# Patient Record
Sex: Female | Born: 1970 | Race: White | Hispanic: No | Marital: Married | Smoking: Never smoker
Health system: Southern US, Community
[De-identification: ages and names within clinical notes are randomized; demographics above are authoritative.]

---

## 2004-01-25 ENCOUNTER — Other Ambulatory Visit: Admission: RE | Admit: 2004-01-25 | Discharge: 2004-01-25 | Payer: Self-pay | Admitting: Gynecology

## 2004-03-13 ENCOUNTER — Ambulatory Visit: Payer: Self-pay | Admitting: Oncology

## 2004-03-29 ENCOUNTER — Other Ambulatory Visit: Admission: RE | Admit: 2004-03-29 | Discharge: 2004-03-29 | Payer: Self-pay | Admitting: Obstetrics and Gynecology

## 2004-06-06 ENCOUNTER — Ambulatory Visit: Payer: Self-pay | Admitting: Oncology

## 2004-07-13 ENCOUNTER — Ambulatory Visit (HOSPITAL_COMMUNITY): Admission: RE | Admit: 2004-07-13 | Discharge: 2004-07-13 | Payer: Self-pay | Admitting: Obstetrics and Gynecology

## 2004-08-24 ENCOUNTER — Inpatient Hospital Stay (HOSPITAL_COMMUNITY): Admission: AD | Admit: 2004-08-24 | Discharge: 2004-08-24 | Payer: Self-pay | Admitting: Obstetrics and Gynecology

## 2004-09-28 ENCOUNTER — Ambulatory Visit: Payer: Self-pay | Admitting: Oncology

## 2004-10-17 ENCOUNTER — Inpatient Hospital Stay (HOSPITAL_COMMUNITY): Admission: AD | Admit: 2004-10-17 | Discharge: 2004-10-20 | Payer: Self-pay | Admitting: Obstetrics and Gynecology

## 2004-11-13 ENCOUNTER — Ambulatory Visit: Payer: Self-pay | Admitting: Oncology

## 2004-12-08 ENCOUNTER — Other Ambulatory Visit: Admission: RE | Admit: 2004-12-08 | Discharge: 2004-12-08 | Payer: Self-pay | Admitting: Obstetrics and Gynecology

## 2005-10-16 ENCOUNTER — Ambulatory Visit: Payer: Self-pay | Admitting: Oncology

## 2006-01-14 ENCOUNTER — Ambulatory Visit: Payer: Self-pay | Admitting: Oncology

## 2006-03-13 ENCOUNTER — Ambulatory Visit: Payer: Self-pay | Admitting: Oncology

## 2006-07-17 ENCOUNTER — Ambulatory Visit: Payer: Self-pay | Admitting: Oncology

## 2006-07-19 LAB — CBC WITH DIFFERENTIAL/PLATELET
BASO%: 0.2 % (ref 0.0–2.0)
Basophils Absolute: 0 10*3/uL (ref 0.0–0.1)
EOS%: 0.5 % (ref 0.0–7.0)
HGB: 13 g/dL (ref 11.6–15.9)
MCH: 30.3 pg (ref 26.0–34.0)
MCV: 86.3 fL (ref 81.0–101.0)
Platelets: 262 10*3/uL (ref 145–400)
RBC: 4.3 10*6/uL (ref 3.70–5.32)
WBC: 9.6 10*3/uL (ref 3.9–10.0)

## 2006-08-09 ENCOUNTER — Inpatient Hospital Stay (HOSPITAL_COMMUNITY): Admission: AD | Admit: 2006-08-09 | Discharge: 2006-08-11 | Payer: Self-pay | Admitting: Obstetrics and Gynecology

## 2006-08-14 LAB — CBC WITH DIFFERENTIAL/PLATELET
BASO%: 0.7 % (ref 0.0–2.0)
Basophils Absolute: 0.1 10*3/uL (ref 0.0–0.1)
EOS%: 1.2 % (ref 0.0–7.0)
HGB: 14.3 g/dL (ref 11.6–15.9)
LYMPH%: 15.1 % (ref 14.0–48.0)
MCV: 86.3 fL (ref 81.0–101.0)
MONO#: 0.4 10*3/uL (ref 0.1–0.9)
NEUT%: 79.9 % — ABNORMAL HIGH (ref 39.6–76.8)
RDW: 13.6 % (ref 11.3–14.5)
WBC: 13.1 10*3/uL — ABNORMAL HIGH (ref 3.9–10.0)
lymph#: 2 10*3/uL (ref 0.9–3.3)

## 2006-08-14 LAB — PROTIME-INR: Protime: 19.2 Seconds — ABNORMAL HIGH (ref 10.6–13.4)

## 2006-08-16 LAB — PROTIME-INR

## 2006-08-26 LAB — PROTIME-INR

## 2006-09-02 ENCOUNTER — Ambulatory Visit: Payer: Self-pay | Admitting: Oncology

## 2006-09-02 LAB — PROTIME-INR: Protime: 46.8 Seconds — ABNORMAL HIGH (ref 10.6–13.4)

## 2007-08-11 ENCOUNTER — Ambulatory Visit: Payer: Self-pay | Admitting: Oncology

## 2008-08-06 ENCOUNTER — Ambulatory Visit: Payer: Self-pay | Admitting: Oncology

## 2014-02-26 ENCOUNTER — Other Ambulatory Visit: Payer: Self-pay | Admitting: Family Medicine

## 2014-02-26 DIAGNOSIS — R202 Paresthesia of skin: Secondary | ICD-10-CM

## 2014-03-07 ENCOUNTER — Ambulatory Visit
Admission: RE | Admit: 2014-03-07 | Discharge: 2014-03-07 | Disposition: A | Discharge status: Home / Self Care | Payer: BC Managed Care – PPO | Source: Ambulatory Visit | Attending: Family Medicine | Admitting: Family Medicine

## 2014-03-07 DIAGNOSIS — R202 Paresthesia of skin: Secondary | ICD-10-CM

## 2014-03-07 MED ORDER — GADOBENATE DIMEGLUMINE 529 MG/ML IV SOLN
15.0000 mL | Freq: Once | INTRAVENOUS | Status: AC | PRN
  Administered 2014-03-07: 15 mL via INTRAVENOUS

## 2015-09-17 DIAGNOSIS — L259 Unspecified contact dermatitis, unspecified cause: Secondary | ICD-10-CM | POA: Diagnosis not present

## 2015-09-23 DIAGNOSIS — L309 Dermatitis, unspecified: Secondary | ICD-10-CM | POA: Diagnosis not present

## 2015-09-23 DIAGNOSIS — L301 Dyshidrosis [pompholyx]: Secondary | ICD-10-CM | POA: Diagnosis not present

## 2015-10-04 DIAGNOSIS — L308 Other specified dermatitis: Secondary | ICD-10-CM | POA: Diagnosis not present

## 2015-10-04 DIAGNOSIS — L309 Dermatitis, unspecified: Secondary | ICD-10-CM | POA: Diagnosis not present

## 2016-01-24 DIAGNOSIS — R0781 Pleurodynia: Secondary | ICD-10-CM | POA: Diagnosis not present

## 2016-01-24 DIAGNOSIS — Z23 Encounter for immunization: Secondary | ICD-10-CM | POA: Diagnosis not present

## 2016-01-24 DIAGNOSIS — F418 Other specified anxiety disorders: Secondary | ICD-10-CM | POA: Diagnosis not present

## 2016-02-03 DIAGNOSIS — Z6830 Body mass index (BMI) 30.0-30.9, adult: Secondary | ICD-10-CM | POA: Diagnosis not present

## 2016-02-03 DIAGNOSIS — Z1212 Encounter for screening for malignant neoplasm of rectum: Secondary | ICD-10-CM | POA: Diagnosis not present

## 2016-02-03 DIAGNOSIS — Z1231 Encounter for screening mammogram for malignant neoplasm of breast: Secondary | ICD-10-CM | POA: Diagnosis not present

## 2016-02-03 DIAGNOSIS — Z01419 Encounter for gynecological examination (general) (routine) without abnormal findings: Secondary | ICD-10-CM | POA: Diagnosis not present

## 2016-09-28 DIAGNOSIS — N958 Other specified menopausal and perimenopausal disorders: Secondary | ICD-10-CM | POA: Diagnosis not present

## 2016-09-28 DIAGNOSIS — Z13820 Encounter for screening for osteoporosis: Secondary | ICD-10-CM | POA: Diagnosis not present

## 2017-02-18 DIAGNOSIS — Z23 Encounter for immunization: Secondary | ICD-10-CM | POA: Diagnosis not present

## 2017-02-18 DIAGNOSIS — F418 Other specified anxiety disorders: Secondary | ICD-10-CM | POA: Diagnosis not present

## 2017-05-17 DIAGNOSIS — Z6830 Body mass index (BMI) 30.0-30.9, adult: Secondary | ICD-10-CM | POA: Diagnosis not present

## 2017-05-17 DIAGNOSIS — Z01419 Encounter for gynecological examination (general) (routine) without abnormal findings: Secondary | ICD-10-CM | POA: Diagnosis not present

## 2017-05-17 DIAGNOSIS — Z1212 Encounter for screening for malignant neoplasm of rectum: Secondary | ICD-10-CM | POA: Diagnosis not present

## 2017-05-17 DIAGNOSIS — Z1231 Encounter for screening mammogram for malignant neoplasm of breast: Secondary | ICD-10-CM | POA: Diagnosis not present

## 2017-07-22 DIAGNOSIS — L602 Onychogryphosis: Secondary | ICD-10-CM | POA: Diagnosis not present

## 2017-09-23 DIAGNOSIS — R238 Other skin changes: Secondary | ICD-10-CM | POA: Diagnosis not present

## 2017-09-23 DIAGNOSIS — L602 Onychogryphosis: Secondary | ICD-10-CM | POA: Diagnosis not present

## 2017-10-15 DIAGNOSIS — B351 Tinea unguium: Secondary | ICD-10-CM | POA: Diagnosis not present

## 2017-10-22 DIAGNOSIS — H6123 Impacted cerumen, bilateral: Secondary | ICD-10-CM | POA: Diagnosis not present

## 2017-11-29 DIAGNOSIS — L02821 Furuncle of head [any part, except face]: Secondary | ICD-10-CM | POA: Diagnosis not present

## 2017-11-29 DIAGNOSIS — B9689 Other specified bacterial agents as the cause of diseases classified elsewhere: Secondary | ICD-10-CM | POA: Diagnosis not present

## 2018-01-16 ENCOUNTER — Emergency Department (HOSPITAL_COMMUNITY)
Admission: EM | Admit: 2018-01-16 | Discharge: 2018-01-16 | Disposition: A | Discharge status: Home / Self Care | Payer: BLUE CROSS/BLUE SHIELD | Attending: Emergency Medicine | Admitting: Emergency Medicine

## 2018-01-16 ENCOUNTER — Other Ambulatory Visit: Payer: Self-pay

## 2018-01-16 ENCOUNTER — Encounter (HOSPITAL_COMMUNITY): Payer: Self-pay | Admitting: Emergency Medicine

## 2018-01-16 DIAGNOSIS — Y999 Unspecified external cause status: Secondary | ICD-10-CM | POA: Insufficient documentation

## 2018-01-16 DIAGNOSIS — Y939 Activity, unspecified: Secondary | ICD-10-CM | POA: Diagnosis not present

## 2018-01-16 DIAGNOSIS — W01190A Fall on same level from slipping, tripping and stumbling with subsequent striking against furniture, initial encounter: Secondary | ICD-10-CM | POA: Diagnosis not present

## 2018-01-16 DIAGNOSIS — Y929 Unspecified place or not applicable: Secondary | ICD-10-CM | POA: Insufficient documentation

## 2018-01-16 DIAGNOSIS — S0181XA Laceration without foreign body of other part of head, initial encounter: Secondary | ICD-10-CM | POA: Insufficient documentation

## 2018-01-16 MED ORDER — LIDOCAINE HCL (PF) 1 % IJ SOLN
10.0000 mL | Freq: Once | INTRAMUSCULAR | Status: AC
  Administered 2018-01-16: 10 mL
  Filled 2018-01-16: qty 10

## 2018-01-16 NOTE — ED Provider Notes (Signed)
[facility] EMERGENCY DEPARTMENT Provider Note  CSN: [id number] Arrival date & time: 01/16/18  1503  History   Chief Complaint Chief Complaint  Patient presents with  . Laceration    HPI Doris Willis is a 47 y.o. female significant medical history who presents for evaluation of chin laceration.  Per patient she got up in the middle the night to use the restroom and tripped over the dog's bed.  She states she hit her chin on the edge of her dresser.  States bleeding was well controlled and she placed a Band-Aid and did not realize the extend of her laceration until she awoke this morning.  Presented to urgent care earlier today and they told her they would be unable to suture this laceration and recommended her to come to the ED for evaluation.  Her tetanus vaccine was updated less than 5 years ago.  Denies fever, chills, headache, loss of consciousness, numbness or tingling, decreased range of motion to jaw. HPI  History reviewed. No pertinent past medical history.  There are no active problems to display for this patient.   History reviewed. No pertinent surgical history.   OB History   None      Home Medications    Prior to Admission medications   Not on File    Family History History reviewed. No pertinent family history.  Social History Social History   Tobacco Use  . Smoking status: Never Smoker  . Smokeless tobacco: Never Used  Substance Use Topics  . Alcohol use: Yes    Alcohol/week: 1.0 standard drinks    Types: 1 Glasses of wine per week  . Drug use: Never     Allergies   Patient has no allergy information on record.   Review of Systems Review of Systems  Constitutional: Negative for activity change, chills, fatigue and fever.  HENT: Negative for dental problem, facial swelling, nosebleeds, rhinorrhea, sore throat, tinnitus, trouble swallowing and voice change.   Gastrointestinal: Negative for nausea and vomiting.    Musculoskeletal: Negative for back pain, neck pain and neck stiffness.  Skin: Positive for wound.  Neurological: Negative for weakness.     Physical Exam Updated Vital Signs BP (!) 139/97   Pulse 91   Temp 98.1 F (36.7 C)   Resp 18   SpO2 99%   Physical Exam  Constitutional: She appears well-developed and well-nourished. No distress.  HENT:  Head: Normocephalic. Head is with laceration. Head is without raccoon's eyes, without Battle's sign, without abrasion, without contusion, without right periorbital erythema and without left periorbital erythema. Hair is normal.    Eyes: Pupils are equal, round, and reactive to light.  Neck: Normal range of motion.  Cardiovascular: Normal rate and intact distal pulses.  Pulmonary/Chest: No respiratory distress.  Abdominal: She exhibits no distension.  Musculoskeletal: Normal range of motion.  Neurological: She is alert.  Skin: Skin is warm and dry.  6cm laceration to chin.  Able to open and close jaw without difficulty.  Able to speak without difficulty.  Bleeding is well controlled.  Psychiatric: She has a normal mood and affect.  Nursing note and vitals reviewed.    ED Treatments / Results  Labs (all labs ordered are listed, but only abnormal results are displayed) Labs Reviewed - No data to display  EKG None  Radiology No results found.  Procedures .Marland KitchenLaceration Repair Date/Time: 01/16/2018 5:02 PM Performed by: Linwood Dibbles, PA-C Authorized by: Linwood Dibbles, PA-C   Consent:  Consent obtained:  Verbal   Consent given by:  Patient   Risks discussed:  Infection, need for additional repair, pain, poor cosmetic result and poor wound healing   Alternatives discussed:  No treatment and delayed treatment Universal protocol:    Procedure explained and questions answered to patient or proxy's satisfaction: yes     Relevant documents present and verified: yes     Test results available and properly labeled: yes      Imaging studies available: yes     Required blood products, implants, devices, and special equipment available: yes     Site/side marked: yes     Immediately prior to procedure, a time out was called: yes     Patient identity confirmed:  Verbally with patient Anesthesia (see MAR for exact dosages):    Anesthesia method:  Local infiltration   Local anesthetic:  Lidocaine 1% w/o epi Laceration details:    Location:  Face   Face location:  Chin   Length (cm):  6 Repair type:    Repair type:  Simple Pre-procedure details:    Preparation:  Patient was prepped and draped in usual sterile fashion Exploration:    Hemostasis achieved with:  Direct pressure   Wound exploration: wound explored through full range of motion     Wound extent: no muscle damage noted, no tendon damage noted and no vascular damage noted     Contaminated: no   Treatment:    Area cleansed with:  Betadine   Amount of cleaning:  Extensive   Irrigation solution:  Sterile saline   Irrigation method:  Syringe Skin repair:    Repair method:  Sutures   Suture size:  5-0   Suture material:  Prolene   Number of sutures:  8 Post-procedure details:    Dressing:  Non-adherent dressing   Patient tolerance of procedure:  Tolerated well, no immediate complications   (including critical care time)  Medications Ordered in ED Medications  lidocaine (PF) (XYLOCAINE) 1 % injection 10 mL (10 mLs Infiltration Given 01/16/18 1611)     Initial Impression / Assessment and Plan / ED Course  I have reviewed the triage vital signs and the nursing notes.  Pertinent labs & imaging results that were available during my care of the patient were reviewed by me and considered in my medical decision making (see chart for details).  74 female presents for evaluation of laceration to chin post mechanical fall approximately 12 hours ago.  Bleeding well controlled.  Denies head injury or loss of consciousness.  Superficial laceration  approximately 6 cm long.  Will suture laceration closed and have follow-up with primary care.  Tolerated procedure well.  Discussed with patient she needs to have reevaluation in 3 to 5 days for suture removal.  Wound edges well approximated.  She is up-to-date on her tetanus shot.  We will have her follow-up with PCP, discussed return precautions with patient.  Patient voiced understanding is agreeable for follow-up    Final Clinical Impressions(s) / ED Diagnoses   Final diagnoses:  Facial laceration, initial encounter    ED Discharge Orders    None       Iyanna Drummer A, PA-C 01/16/18 1713    Little, Ambrose Finland, MD 01/17/18 [phone]

## 2018-01-16 NOTE — ED Notes (Signed)
Declined W/C at D/C and was escorted to lobby by RN. 

## 2018-01-16 NOTE — ED Triage Notes (Signed)
Pt here for a chin lac after a fall , no loc , bleeding controlled

## 2018-01-16 NOTE — Discharge Instructions (Addendum)
Evaluated today for laceration near her chin.  You have 8 sutures.  You need to have these evaluated in 4 to 5 days for removal.  To the ED with any new or worsening symptoms such as:  When should I seek help for my wound closure? Contact your doctor if: You have a fever. You have chills. You have redness, puffiness (swelling), or pain at the site of your wound. You have fluid, blood, or pus coming from your wound. There is a bad smell coming from your wound. The skin edges of your wound start to separate after your sutures have been removed. Your wound becomes thick, raised, and darker in color after your sutures come out (scarring).

## 2018-01-20 DIAGNOSIS — S0181XD Laceration without foreign body of other part of head, subsequent encounter: Secondary | ICD-10-CM | POA: Diagnosis not present

## 2018-01-20 DIAGNOSIS — B351 Tinea unguium: Secondary | ICD-10-CM | POA: Diagnosis not present

## 2018-01-20 DIAGNOSIS — Z4802 Encounter for removal of sutures: Secondary | ICD-10-CM | POA: Diagnosis not present

## 2018-01-20 DIAGNOSIS — Z23 Encounter for immunization: Secondary | ICD-10-CM | POA: Diagnosis not present

## 2018-06-06 DIAGNOSIS — Z1231 Encounter for screening mammogram for malignant neoplasm of breast: Secondary | ICD-10-CM | POA: Diagnosis not present

## 2018-06-06 DIAGNOSIS — Z6828 Body mass index (BMI) 28.0-28.9, adult: Secondary | ICD-10-CM | POA: Diagnosis not present

## 2018-06-06 DIAGNOSIS — Z01419 Encounter for gynecological examination (general) (routine) without abnormal findings: Secondary | ICD-10-CM | POA: Diagnosis not present

## 2018-06-10 ENCOUNTER — Other Ambulatory Visit: Payer: Self-pay | Admitting: Obstetrics and Gynecology

## 2018-06-10 DIAGNOSIS — R928 Other abnormal and inconclusive findings on diagnostic imaging of breast: Secondary | ICD-10-CM

## 2018-06-13 ENCOUNTER — Ambulatory Visit
Admission: RE | Admit: 2018-06-13 | Discharge: 2018-06-13 | Disposition: A | Discharge status: Home / Self Care | Payer: BLUE CROSS/BLUE SHIELD | Source: Ambulatory Visit | Attending: Obstetrics and Gynecology | Admitting: Obstetrics and Gynecology

## 2018-06-13 DIAGNOSIS — R928 Other abnormal and inconclusive findings on diagnostic imaging of breast: Secondary | ICD-10-CM

## 2018-06-13 DIAGNOSIS — N6001 Solitary cyst of right breast: Secondary | ICD-10-CM | POA: Diagnosis not present

## 2018-10-01 DIAGNOSIS — B351 Tinea unguium: Secondary | ICD-10-CM | POA: Diagnosis not present

## 2019-07-21 DIAGNOSIS — Z6828 Body mass index (BMI) 28.0-28.9, adult: Secondary | ICD-10-CM | POA: Diagnosis not present

## 2019-07-21 DIAGNOSIS — Z01419 Encounter for gynecological examination (general) (routine) without abnormal findings: Secondary | ICD-10-CM | POA: Diagnosis not present

## 2019-07-21 DIAGNOSIS — Z13820 Encounter for screening for osteoporosis: Secondary | ICD-10-CM | POA: Diagnosis not present

## 2019-09-17 DIAGNOSIS — Z1231 Encounter for screening mammogram for malignant neoplasm of breast: Secondary | ICD-10-CM | POA: Diagnosis not present

## 2019-10-27 IMAGING — US ULTRASOUND RIGHT BREAST LIMITED
1 series · 5 of 5 positions shown · non-contrast
Comparison: Previous exam(s).

CLINICAL DATA: 47-year-old female for further evaluation of
UPPER-OUTER RIGHT breast mass identified on screening mammogram.

EXAM:
ULTRASOUND OF THE RIGHT BREAST

[Series 1: ultrasound right breast limited · 0.06mm/px · 5 of 5 slices shown]
[im 1/5]
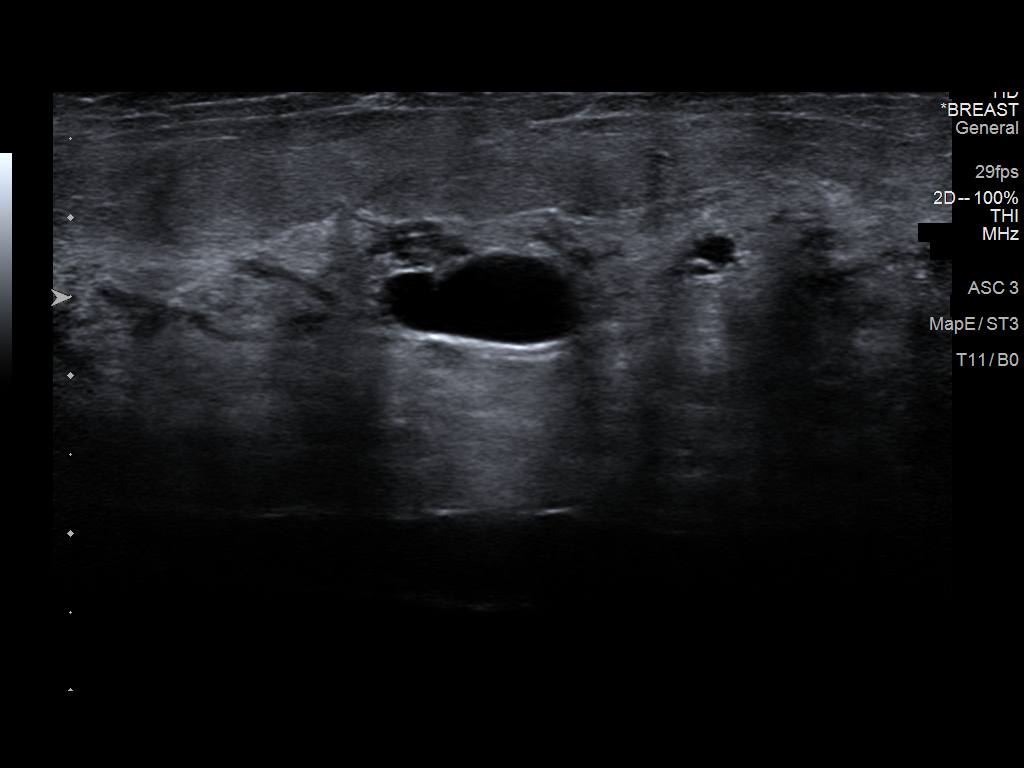
[im 2/5]
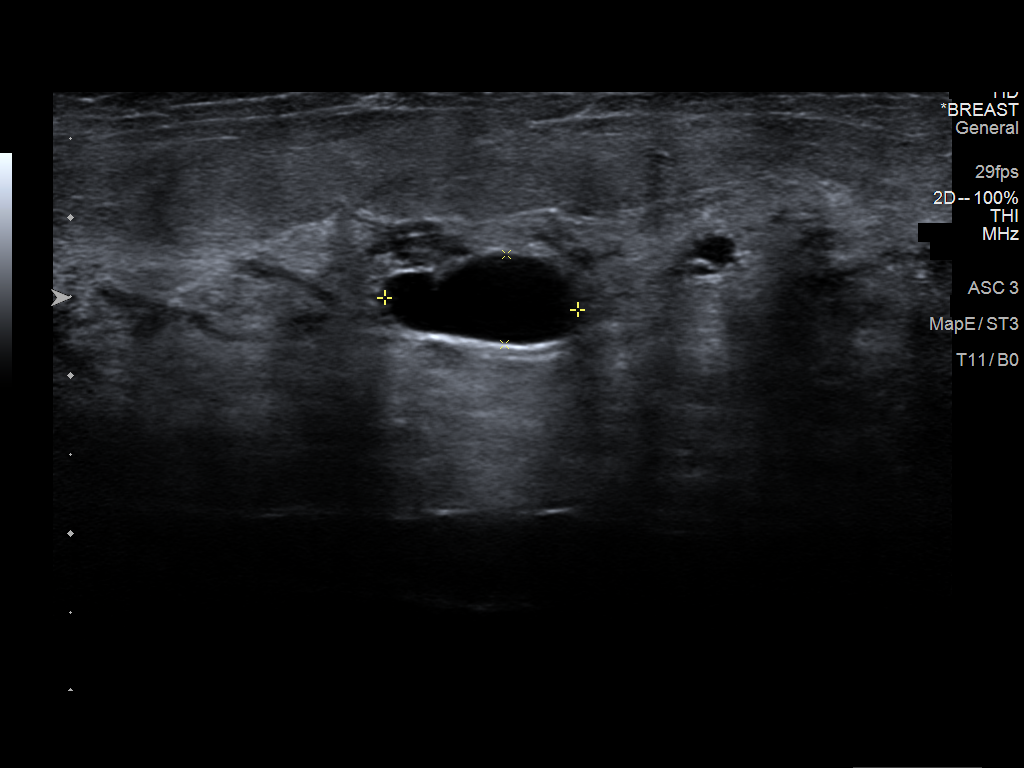
[im 3/5]
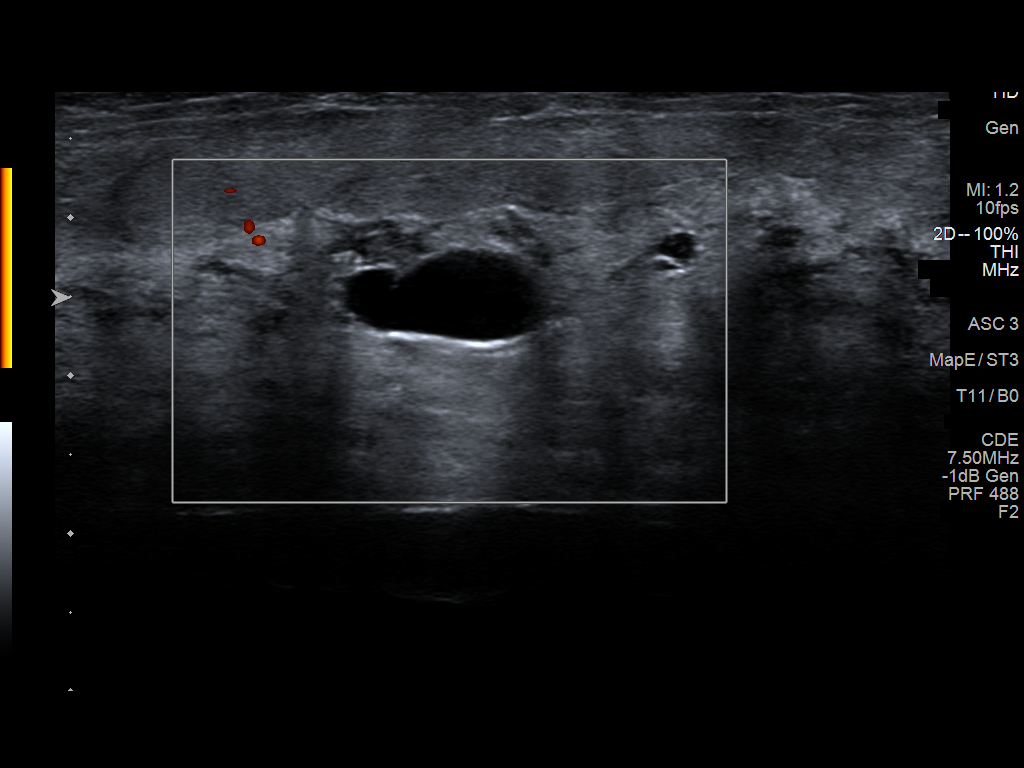
[im 4/5]
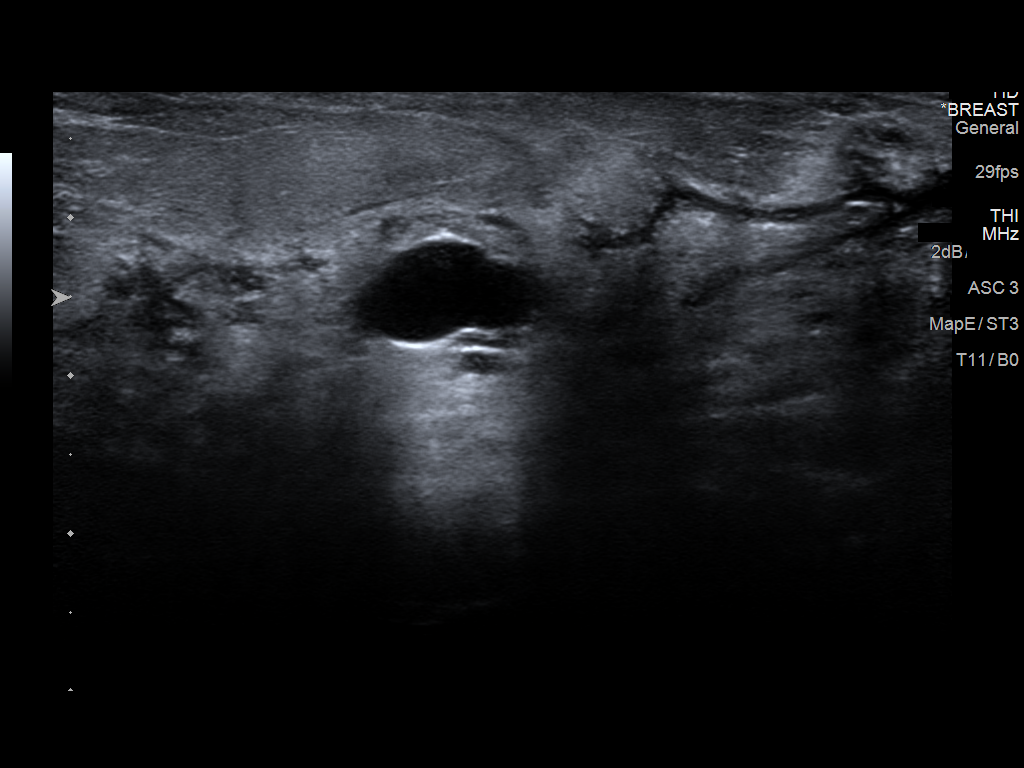
[im 5/5]
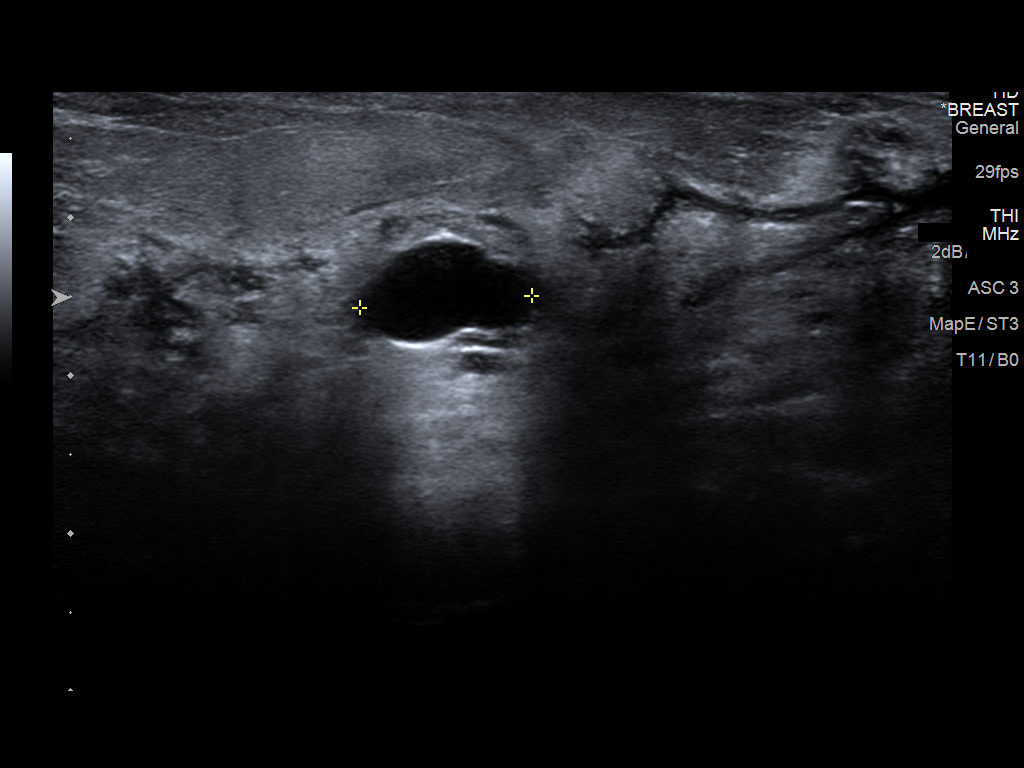

[5 of 5 positions shown; findings below may reference images not displayed]

FINDINGS: Targeted ultrasound is performed, showing a 1.2 x 0.6 x 1.1 cm
benign simple cyst at the 10 o'clock position of the RIGHT breast 1
cm from the nipple, corresponding to the screening study mass.
IMPRESSION: Benign cyst within the UPPER-OUTER RIGHT breast corresponding to the
screening study finding.

RECOMMENDATION:
Bilateral screening mammogram in 1 year

I have discussed the findings and recommendations with the patient.
Results were also provided in writing at the conclusion of the
visit. If applicable, a reminder letter will be sent to the patient
regarding the next appointment.

BI-RADS CATEGORY  2: Benign.

## 2020-12-14 DIAGNOSIS — F418 Other specified anxiety disorders: Secondary | ICD-10-CM | POA: Diagnosis not present

## 2020-12-14 DIAGNOSIS — D6851 Activated protein C resistance: Secondary | ICD-10-CM | POA: Diagnosis not present

## 2020-12-14 DIAGNOSIS — Z0000 Encounter for general adult medical examination without abnormal findings: Secondary | ICD-10-CM | POA: Diagnosis not present

## 2023-10-06 ENCOUNTER — Other Ambulatory Visit: Payer: Self-pay

## 2023-10-06 ENCOUNTER — Emergency Department (HOSPITAL_COMMUNITY)
Admission: EM | Admit: 2023-10-06 | Discharge: 2023-10-06 | Disposition: A | Discharge status: Home / Self Care | Attending: Emergency Medicine | Admitting: Emergency Medicine

## 2023-10-06 DIAGNOSIS — S0990XA Unspecified injury of head, initial encounter: Secondary | ICD-10-CM | POA: Diagnosis present

## 2023-10-06 DIAGNOSIS — S0181XA Laceration without foreign body of other part of head, initial encounter: Secondary | ICD-10-CM | POA: Insufficient documentation

## 2023-10-06 DIAGNOSIS — W01198A Fall on same level from slipping, tripping and stumbling with subsequent striking against other object, initial encounter: Secondary | ICD-10-CM | POA: Insufficient documentation

## 2023-10-06 MED ORDER — LIDOCAINE-EPINEPHRINE-TETRACAINE (LET) TOPICAL GEL
3.0000 mL | Freq: Once | TOPICAL | Status: AC
  Administered 2023-10-06: 3 mL via TOPICAL
  Filled 2023-10-06: qty 3

## 2023-10-06 MED ORDER — LIDOCAINE HCL (PF) 1 % IJ SOLN
10.0000 mL | Freq: Once | INTRAMUSCULAR | Status: AC
  Administered 2023-10-06: 10 mL
  Filled 2023-10-06: qty 10

## 2023-10-06 NOTE — Discharge Instructions (Addendum)
 You had 5 stitches placed on your chin laceration today.  Keep these dry for the next 2 days.  After that you can shower normally with soap and water.  The stitches need to be removed in 7 days.  This can happen at an urgent care or doctor's office, and does not require visiting the ER.  If you do notice redness spreading around your wound site, pus or yellow drainage, or any other emergency concerns, please call your doctor's office or visit the nearest urgent care or hospital.

## 2023-10-06 NOTE — ED Provider Notes (Signed)
 [place] EMERGENCY DEPARTMENT AT [facility] Provider Note   CSN: [id number] Arrival date & time: 10/06/23  9096     Patient presents with: chin laceration   Doris Willis is a 53 y.o. female presenting to ED with a fall and laceration to the left side of her chin.  This occurred last night.  She did strike her head, no loss of consciousness, not on blood thinners, no headache.   HPI     Prior to Admission medications   Not on File    Allergies: Penicillins    Review of Systems  Updated Vital Signs BP (!) 133/98 (BP Location: Right Arm)   Pulse (!) 102   Temp 98.4 F (36.9 C)   Resp 16   Ht 5' 3 (1.6 m)   Wt 72.1 kg   SpO2 93%   BMI 28.17 kg/m   Physical Exam Constitutional:      General: She is not in acute distress. HENT:     Head: Normocephalic.      Comments: Left periorbital ecchymosis Y shaped laceration left lower chin  Eyes:     Conjunctiva/sclera: Conjunctivae normal.     Pupils: Pupils are equal, round, and reactive to light.    Cardiovascular:     Rate and Rhythm: Normal rate and regular rhythm.  Pulmonary:     Effort: Pulmonary effort is normal. No respiratory distress.  Abdominal:     General: There is no distension.     Tenderness: There is no abdominal tenderness.   Skin:    General: Skin is warm and dry.   Neurological:     General: No focal deficit present.     Mental Status: She is alert. Mental status is at baseline.   Psychiatric:        Mood and Affect: Mood normal.        Behavior: Behavior normal.     (all labs ordered are listed, but only abnormal results are displayed) Labs Reviewed - No data to display  EKG: None  Radiology: No results found.   .Laceration Repair  Date/Time: 10/06/2023 12:07 PM  Performed by: Cottie Donnice PARAS, MD Authorized by: Cottie Donnice PARAS, MD   Consent:    Consent obtained:  Verbal   Consent given by:  Patient   Risks discussed:  Pain, poor cosmetic result and  poor wound healing Universal protocol:    Procedure explained and questions answered to patient or proxy's satisfaction: yes     Relevant documents present and verified: yes     Test results available: yes     Imaging studies available: yes     Required blood products, implants, devices, and special equipment available: yes     Site/side marked: yes     Immediately prior to procedure, a time out was called: yes     Patient identity confirmed:  Arm band Anesthesia:    Anesthesia method:  Topical application and local infiltration   Topical anesthetic:  LET   Local anesthetic:  Lidocaine  1% w/o epi Laceration details:    Location:  Face   Face location:  Chin   Length (cm):  3 Exploration:    Imaging outcome: foreign body not noted     Wound exploration: wound explored through full range of motion and entire depth of wound visualized     Contaminated: no   Treatment:    Area cleansed with:  Saline   Amount of cleaning:  Standard   Irrigation solution:  Sterile saline Skin repair:    Repair method:  Sutures   Suture size:  4-0   Suture material:  Prolene   Suture technique:  Simple interrupted   Number of sutures:  5 (1 figure of 8 suture placed) Approximation:    Approximation:  Close Repair type:    Repair type:  Intermediate Post-procedure details:    Dressing:  Open (no dressing)   Procedure completion:  Tolerated well, no immediate complications    Medications Ordered in the ED  lidocaine  (PF) (XYLOCAINE ) 1 % injection 10 mL (has no administration in time range)  lidocaine -EPINEPHrine-tetracaine (LET) topical gel (3 mLs Topical Given 10/06/23 1132)                                    Medical Decision Making Risk Prescription drug management.   Laceration was irrigated and repaired  Overall low suspicion for intracranial hemorrhage or bleed.  Discussed CT imaging but deferred at this time.  Low suspicion for spinal fracture.  No evidence of infection.  No role  for antibiotics at this time.  Tetanus up-to-date.     Final diagnoses:  Injury of head, initial encounter  Chin laceration, initial encounter    ED Discharge Orders     None          Cordarious Zeek, Donnice PARAS, MD 10/06/23 1208

## 2023-10-06 NOTE — ED Triage Notes (Signed)
 Pt. Stated, I fell last night , tripped and fell.. Pt's laceration is to the side right above the left side in an Y shape. Happened last night.
